# Patient Record
Sex: Female | Born: 2000 | Race: Black or African American | Hispanic: No | Marital: Single | Smoking: Never smoker
Health system: Southern US, Community
[De-identification: ages and names within clinical notes are randomized; demographics above are authoritative.]

## PROBLEM LIST (undated history)

## (undated) ENCOUNTER — Emergency Department (HOSPITAL_COMMUNITY): Payer: Self-pay

---
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
## 2021-12-25 ENCOUNTER — Encounter (HOSPITAL_COMMUNITY): Payer: Self-pay

## 2021-12-25 ENCOUNTER — Encounter (HOSPITAL_COMMUNITY): Payer: Self-pay | Admitting: Mental Health

## 2021-12-25 ENCOUNTER — Ambulatory Visit (INDEPENDENT_AMBULATORY_CARE_PROVIDER_SITE_OTHER): Payer: No Payment, Other | Admitting: Mental Health

## 2021-12-25 DIAGNOSIS — F1210 Cannabis abuse, uncomplicated: Secondary | ICD-10-CM | POA: Insufficient documentation

## 2021-12-25 DIAGNOSIS — F1020 Alcohol dependence, uncomplicated: Secondary | ICD-10-CM

## 2021-12-25 DIAGNOSIS — F322 Major depressive disorder, single episode, severe without psychotic features: Secondary | ICD-10-CM | POA: Diagnosis not present

## 2021-12-25 DIAGNOSIS — F4310 Post-traumatic stress disorder, unspecified: Secondary | ICD-10-CM | POA: Insufficient documentation

## 2021-12-25 NOTE — Plan of Care (Signed)
  Problem: Depression CCP Problem  1 MDD severe Goal: LTG: Paula Hardin WILL SCORE LESS THAN 10 ON THE PATIENT HEALTH QUESTIONNAIRE (PHQ-9) Outcome: Initial Goal: STG: Paula Hardin will decrease sxs of depression AEB development of x 3 effective coping skills with ability to reframe distorted thinking 7/7 days within the next 6 months  Outcome: Initial
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
## 2022-01-10 ENCOUNTER — Ambulatory Visit (INDEPENDENT_AMBULATORY_CARE_PROVIDER_SITE_OTHER): Payer: No Payment, Other | Admitting: Mental Health

## 2022-01-10 DIAGNOSIS — F1020 Alcohol dependence, uncomplicated: Secondary | ICD-10-CM

## 2022-01-10 DIAGNOSIS — F1210 Cannabis abuse, uncomplicated: Secondary | ICD-10-CM

## 2022-01-10 DIAGNOSIS — F322 Major depressive disorder, single episode, severe without psychotic features: Secondary | ICD-10-CM | POA: Diagnosis not present

## 2022-01-10 DIAGNOSIS — F4310 Post-traumatic stress disorder, unspecified: Secondary | ICD-10-CM
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
## 2022-02-06 ENCOUNTER — Ambulatory Visit (INDEPENDENT_AMBULATORY_CARE_PROVIDER_SITE_OTHER): Payer: No Payment, Other | Admitting: Mental Health

## 2022-02-06 DIAGNOSIS — F322 Major depressive disorder, single episode, severe without psychotic features: Secondary | ICD-10-CM | POA: Diagnosis not present

## 2022-02-06 DIAGNOSIS — F1020 Alcohol dependence, uncomplicated: Secondary | ICD-10-CM

## 2022-02-06 DIAGNOSIS — F1210 Cannabis abuse, uncomplicated: Secondary | ICD-10-CM

## 2022-02-06 DIAGNOSIS — F4310 Post-traumatic stress disorder, unspecified: Secondary | ICD-10-CM

## 2022-02-06 NOTE — Progress Notes (Signed)
THERAPIST PROGRESS NOTE Virtual Visit via Video Note  I connected with Paula Hardin on 02/06/22 at  9:00 AM EST by a video enabled telemedicine application and verified that I am speaking with the correct person using two identifiers.  Location: Patient: home address on file  Provider: office    I discussed the limitations of evaluation and management by telemedicine and the availability of in person appointments. The patient expressed understanding and agreed to proceed.  I discussed the assessment and treatment plan with the patient. The patient was provided an opportunity to ask questions and all were answered. The patient agreed with the plan and demonstrated an understanding of the instructions.   The patient was advised to call back or seek an in-person evaluation if the symptoms worsen or if the condition fails to improve as anticipated.  I provided 53 minutes of non-face-to-face time during this encounter.   Paula Hardin, [facility]   Session Time: 9:04am (53 minutes)   Participation Level: Active  Behavioral Response: CasualAlertEuthymic  Type of Therapy: Individual Therapy  Treatment Goals addressed: STG: Paula Hardin will decrease sxs of depression AEB development of x 3 effective coping skills with ability to reframe distorted thinking 7/7 days within the next 6 months    ProgressTowards Goals: Progressing  Interventions: CBT and Supportive  Summary:   Paula Hardin is a 22 y.o. female who presents with dx of MDD, PTSD, alcohol use moderate and cannabis use mild. Presents for session alert and oriented; mood and affect adequate. Speech clear and coherent at normal rate and tone. Shares for moods to have been "ok " and shares moods have improved. Reports for the holidays to have gone well and Thanksgiving was the hardest with mother's passing. Shares for the anniversary of mother's passing to be approaching. Currently rates depression 4/10 with 10 being the worse.  PHQ: 14 (decrease from 24) and GAD: 15 (decrease from 21). Shares engagement in therapy has been supportive in reducing severity of depression. Engaged with therapist and processes working to give self grace and acknowledge unhelpful thinking styles. 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 Shares hx of homelessness and growing up with parents yelling and fighting and mother's suicide last year. Shares has been working to be more optimistic. Reports reduction in alcohol and cannabis use and shares making efforts to not drink as means of coping with emotion. 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 Agrees to obtain journal as means of coping and processing thoughts in healthy manner. Progress on treatment goals with decrease in depression reported and ability to reframe distorted thinking. Improvement in sxs noted.   Suicidal/Homicidal: Nowithout intent/plan  Therapist Response: Therapist engaged [employer] in [place]. Confirmed current location and confidentiality of session. Completed check in and assessed for current level of stressors, sxs management. Reviewed previous session. Provided safe space for Paula Hardin to share thoughts and concerns related to recent holidays and feelings of grief. Provided support and encouragement; validated feelings. Engaged Paula Hardin in identification of feelings and provided education on feelings being transient and decreasing severity of intense feelings over time. Explored use of healthy coping skills and coping without means of substances for avoidance of feelings. Engaged Paula Hardin in processing importance of healthy habits to support in overall level of functioning.  Supported in reframing distorted thoughts and encouraged  giving self grace and engagement in self-care. Encouraged obtaining journal as means of coping. Reviewed session and provided follow up. Assessed for safety concerns.   Plan: Return again in  x 3 weeks.  Diagnosis: Severe major depression, single episode (HCC)  Post-traumatic stress  Moderate alcohol use disorder (HCC)  Mild cannabis use disorder  Collaboration of Care: Other None  Patient/Guardian was advised Release of Information must be obtained prior to any record release in order to collaborate their care with an outside provider. Patient/Guardian was advised if they have not already done so to contact the registration department to sign all necessary forms in order for [place] to release information regarding their care.   Consent: Patient/Guardian gives verbal consent for treatment and assignment of benefits for services provided during this visit. Patient/Guardian expressed understanding and agreed to proceed.   Paula Hardin Paula Hardin, [facility] 02/06/2022

## 2022-03-01 ENCOUNTER — Encounter (HOSPITAL_COMMUNITY): Payer: Self-pay

## 2022-03-01 ENCOUNTER — Telehealth (HOSPITAL_COMMUNITY): Payer: Self-pay | Admitting: Mental Health

## 2022-03-01 ENCOUNTER — Ambulatory Visit (HOSPITAL_COMMUNITY): Payer: No Payment, Other | Admitting: Mental Health

## 2022-03-01 NOTE — Telephone Encounter (Signed)
Therapist sent link via [place] for OP telehealth session. No response after x 10 minutes. Contact on telephone, call went straight to VM x 2 with VM box reported to not be set up yet.

## 2022-05-24 ENCOUNTER — Ambulatory Visit (HOSPITAL_COMMUNITY): Payer: No Payment, Other | Admitting: Mental Health

## 2022-05-24 ENCOUNTER — Telehealth (HOSPITAL_COMMUNITY): Payer: Self-pay | Admitting: Mental Health

## 2022-05-24 ENCOUNTER — Encounter (HOSPITAL_COMMUNITY): Payer: Self-pay

## 2022-05-24 NOTE — Progress Notes (Unsigned)
Pt connected to tele-therapy appointment and reported to be in the state of [place]. Shares will return on Monday and would like to be worked in for an appointment when therapist has cancellation. Therapist agreed. Therapist assessed for safety concerns; Pt denies SI/HI

## 2022-08-15 NOTE — Telephone Encounter (Signed)
Error
